# Patient Record
Sex: Female | Born: 2005
Health system: Southern US, Community
[De-identification: ages and names within clinical notes are randomized; demographics above are authoritative.]

## PROBLEM LIST (undated history)

## (undated) DIAGNOSIS — Z0000 Encounter for general adult medical examination without abnormal findings: Secondary | ICD-10-CM

## (undated) DIAGNOSIS — T7840XA Allergy, unspecified, initial encounter: Secondary | ICD-10-CM

## (undated) DIAGNOSIS — L74510 Primary focal hyperhidrosis, axilla: Secondary | ICD-10-CM

## (undated) HISTORY — DX: Allergy, unspecified, initial encounter: T78.40XA

---
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
## 2023-01-18 ENCOUNTER — Other Ambulatory Visit: Payer: Self-pay | Admitting: Nurse Practitioner

## 2023-01-18 NOTE — Telephone Encounter (Signed)
RF received for [facility] #[id number].  Last AEX 01/24/22.  No appointment scheduled.

## 2023-02-08 ENCOUNTER — Other Ambulatory Visit: Payer: Self-pay | Admitting: Nurse Practitioner

## 2023-02-08 NOTE — Telephone Encounter (Signed)
Will provide refills until May so she can schedule OV when she is out for summer. Thanks.

## 2023-02-08 NOTE — Telephone Encounter (Signed)
Spoke w/ pt's mother per DPR and notified her to have Kameisha make an appt for when she returns home for the summer. She voiced understanding.

## 2023-02-08 NOTE — Telephone Encounter (Signed)
Last AEX 01/24/22. Not scheduled.  Message sent to appt desk to call her to arrange AEX.

## 2023-02-08 NOTE — Telephone Encounter (Signed)
Natasha Williams, RMA Left message for patient to call and schedule appointment.

## 2023-02-08 NOTE — Telephone Encounter (Signed)
Pt reports that she is in [place], [place] for college and is unable to schedule an in person visit at this time but is requesting refills on her BCPs. Please advise.

## 2023-03-01 ENCOUNTER — Other Ambulatory Visit: Payer: Self-pay | Admitting: Nurse Practitioner

## 2023-03-01 NOTE — Telephone Encounter (Signed)
Med refill request: Natasha Williams,  Last AEX: 01/24/22 Next AEX: 04/23/23 Last MMG (if hormonal med) n/a Refill authorized: Please Advise

## 2023-03-27 ENCOUNTER — Other Ambulatory Visit: Payer: Self-pay | Admitting: Nurse Practitioner

## 2023-03-27 NOTE — Telephone Encounter (Signed)
Last AEX 01/24/2022 with Tiffany, NP Scheduled AEX 04/23/2023 w Elmarie Shiley, NP

## 2023-04-23 ENCOUNTER — Ambulatory Visit: Admitting: Nurse Practitioner

## 2023-04-23 NOTE — Progress Notes (Deleted)
   Natasha Williams 11/12/2006 [id number]   History:  17 y.o. G0 presents for annual exam. Nexplanon 02/2021. She has experienced breakthrough bleeding with Nexplanon and has been taking OCPs with good improvement of bleeding. Received one dose of Gardasil last year.   Gynecologic History No LMP recorded. Patient has had an implant.   Contraception/Family planning: Nexplanon Sexually active: ***  Health Maintenance Last Pap: Not indicated Last mammogram: Not indicated Last colonoscopy: Not indicated Last Dexa: Not indicated:   Past medical history, past surgical history, family history and social history were all reviewed and documented in the EPIC chart. Senior in [employer]. Plays golf. Plans for college in the fall.   ROS:  A ROS was performed and pertinent positives and negatives are included.  Exam:  There were no vitals filed for this visit.  There is no height or weight on file to calculate BMI.  General appearance:  Normal Thyroid:  Symmetrical, normal in size, without palpable masses or nodularity. Respiratory  Auscultation:  Clear without wheezing or rhonchi Cardiovascular  Auscultation:  Regular rate, without rubs, murmurs or gallops  Edema/varicosities:  Not grossly evident Abdominal  Soft,nontender, without masses, guarding or rebound.  Liver/spleen:  No organomegaly noted  Hernia:  None appreciated  Skin  Inspection:  Grossly normal Breasts: Not indicated Genitourinary Not indicated  Assessment/Plan:  17 y.o. G0 for annual exam.   Well female exam with routine gynecological exam - Education provided on SBEs, importance of preventative screenings, current guidelines, high calcium diet, regular exercise, and multivitamin daily.   Encounter for surveillance of implantable subdermal contraceptive - Nexplanon 02/2021. She has experienced breakthrough bleeding with Nexplanon and has been taking OCPs with good improvement of bleeding. She wants to continue this regimen.    Screen for STD (sexually transmitted disease) - Plan: C. trachomatis/N. gonorrhoeae RNA. Obtained from urine.   Breakthrough bleeding on Nexplanon - Good management on low dose OCPs. Wants to continue.   Need for HPV vaccination - Plan: HPV 9-valent vaccine,Recombinat. First dose today. Will return in 2 months for second dose.   Return in 1 year for annual.     Olivia Mackie DNP, 7:41 AM 04/23/2023

## 2023-06-17 ENCOUNTER — Other Ambulatory Visit: Payer: Self-pay | Admitting: Nurse Practitioner

## 2024-03-10 ENCOUNTER — Encounter: Payer: Self-pay | Admitting: Nurse Practitioner

## 2024-03-10 ENCOUNTER — Ambulatory Visit (INDEPENDENT_AMBULATORY_CARE_PROVIDER_SITE_OTHER): Admitting: Nurse Practitioner

## 2024-03-10 VITALS — BP 136/74

## 2024-03-10 DIAGNOSIS — Z3046 Encounter for surveillance of implantable subdermal contraceptive: Secondary | ICD-10-CM

## 2024-03-10 DIAGNOSIS — Z3041 Encounter for surveillance of contraceptive pills: Secondary | ICD-10-CM

## 2024-03-10 DIAGNOSIS — Z3009 Encounter for other general counseling and advice on contraception: Secondary | ICD-10-CM

## 2024-03-10 MED ORDER — DROSPIRENONE-ETHINYL ESTRADIOL 3-0.02 MG PO TABS: 1.0000 | ORAL_TABLET | Freq: Every day | ORAL | 3 refills | Status: AC

## 2024-03-10 NOTE — Progress Notes (Signed)
 18 y.o. G0P0000 presents for Nexplanon removal.  She has been experiencing weight gain, BTB bleeding. Has been taking OCPs to manage bleeding since April 2024. She has decided to use COCs for future contraception.  Procedure, risks and benefits have all been explained.  She has the following questions today:  None.     LMP:  02/12/2024   After all questions were answered, consent was obtained.    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 No pertinent surgical history.  No current outpatient medications on file prior to visit.   No current facility-administered medications on file prior to visit.   No Known Allergies  Vitals:   03/10/24 0936  BP: 136/74   Timeout performed and consent obtained.   Procedure: Patient placed supine on exam table with her left arm flexed at the elbow. The prior insertion site was located and the Nexplanon rod was palpated.  Area cleansed with Betadine x 3 and draped in normal sterile fashion.  Insertion site and surrounding tissue anesthetized with 1% Lidocaine without epinephrine, 2cc total used.  Small incision made with #11 blade.  Nexplanon removed without difficulty.  Steri-strips were applied and pressure dressing placed over the site.  Entire procedure performed with sterile technique.  Pt tolerated procedure well.  Assessment:   Encounter for Nexplanon removal - Plan: Removal of implanon rod  General counseling and advice on female contraception  Encounter for surveillance of contraceptive pills - Plan: drospirenone-ethinyl estradiol (NIKKI) 3-0.02 MG tablet daily. Aware of proper use.    Plan:  Post procedure instructions reviewed with pt.  Questions answered.  Pt knows to call with any concerns or questions.  New contraception:  COCs
